# Patient Record
Sex: Female | Born: 1995 | Race: Black or African American | Hispanic: No | Marital: Single | Smoking: Heavy tobacco smoker
Health system: Southern US, Community
[De-identification: ages and names within clinical notes are randomized; demographics above are authoritative.]

## PROBLEM LIST (undated history)

## (undated) DIAGNOSIS — J45909 Unspecified asthma, uncomplicated: Secondary | ICD-10-CM

## (undated) HISTORY — PX: CHOLECYSTECTOMY: SHX55

---

## 2014-08-18 ENCOUNTER — Emergency Department (HOSPITAL_COMMUNITY): Payer: BLUE CROSS/BLUE SHIELD

## 2014-08-18 ENCOUNTER — Emergency Department (HOSPITAL_COMMUNITY)
Admission: EM | Admit: 2014-08-18 | Discharge: 2014-08-18 | Disposition: A | Discharge status: Home / Self Care | Payer: BLUE CROSS/BLUE SHIELD | Attending: Emergency Medicine | Admitting: Emergency Medicine

## 2014-08-18 ENCOUNTER — Encounter (HOSPITAL_COMMUNITY): Payer: Self-pay | Admitting: Emergency Medicine

## 2014-08-18 DIAGNOSIS — R109 Unspecified abdominal pain: Secondary | ICD-10-CM | POA: Insufficient documentation

## 2014-08-18 DIAGNOSIS — R102 Pelvic and perineal pain: Secondary | ICD-10-CM | POA: Insufficient documentation

## 2014-08-18 DIAGNOSIS — J45909 Unspecified asthma, uncomplicated: Secondary | ICD-10-CM | POA: Insufficient documentation

## 2014-08-18 DIAGNOSIS — Z3202 Encounter for pregnancy test, result negative: Secondary | ICD-10-CM | POA: Diagnosis not present

## 2014-08-18 HISTORY — DX: Unspecified asthma, uncomplicated: J45.909

## 2014-08-18 LAB — URINALYSIS, ROUTINE W REFLEX MICROSCOPIC
BILIRUBIN URINE: NEGATIVE
GLUCOSE, UA: NEGATIVE mg/dL
HGB URINE DIPSTICK: NEGATIVE
KETONES UR: NEGATIVE mg/dL
Leukocytes, UA: NEGATIVE
Nitrite: NEGATIVE
PH: 7 (ref 5.0–8.0)
Protein, ur: NEGATIVE mg/dL
Specific Gravity, Urine: 1.007 (ref 1.005–1.030)
Urobilinogen, UA: 1 mg/dL (ref 0.0–1.0)

## 2014-08-18 LAB — HIV ANTIBODY (ROUTINE TESTING W REFLEX): HIV 1&2 Ab, 4th Generation: NONREACTIVE

## 2014-08-18 LAB — WET PREP, GENITAL
TRICH WET PREP: NONE SEEN
WBC WET PREP: NONE SEEN
YEAST WET PREP: NONE SEEN

## 2014-08-18 LAB — PREGNANCY, URINE: Preg Test, Ur: NEGATIVE

## 2014-08-18 MED ORDER — NAPROXEN 500 MG PO TABS
500.0000 mg | ORAL_TABLET | Freq: Once | ORAL | Status: AC
  Administered 2014-08-18: 500 mg via ORAL
  Filled 2014-08-18: qty 1

## 2014-08-18 MED ORDER — IOHEXOL 300 MG/ML  SOLN
100.0000 mL | Freq: Once | INTRAMUSCULAR | Status: AC | PRN
  Administered 2014-08-18: 100 mL via INTRAVENOUS

## 2014-08-18 MED ORDER — HYDROMORPHONE HCL 1 MG/ML IJ SOLN
1.0000 mg | Freq: Once | INTRAMUSCULAR | Status: AC
  Administered 2014-08-18: 1 mg via INTRAVENOUS
  Filled 2014-08-18: qty 1

## 2014-08-18 MED ORDER — HYDROCODONE-ACETAMINOPHEN 5-325 MG PO TABS: 1.0000 | ORAL_TABLET | ORAL | Status: AC | PRN

## 2014-08-18 MED ORDER — DIPHENHYDRAMINE HCL 50 MG/ML IJ SOLN
25.0000 mg | Freq: Once | INTRAMUSCULAR | Status: AC
  Administered 2014-08-18: 25 mg via INTRAVENOUS
  Filled 2014-08-18: qty 1

## 2014-08-18 MED ORDER — IOHEXOL 300 MG/ML  SOLN
50.0000 mL | Freq: Once | INTRAMUSCULAR | Status: AC | PRN
  Administered 2014-08-18: 50 mL via ORAL

## 2014-08-18 NOTE — ED Notes (Signed)
Bed: WA03 Expected date:  Expected time:  Means of arrival:  Comments: 

## 2014-08-18 NOTE — ED Provider Notes (Signed)
CSN: [id number]     Arrival date & time 08/18/14  0119 History   First MD Initiated Contact with Patient 08/18/14 0324     Chief Complaint  Patient presents with  . Vaginal Pain     (Consider location/radiation/quality/duration/timing/severity/associated sxs/prior Treatment) HPI Comments: 18 year old female, presents with acute onset of vaginal pain 3 hours ago while she was coughing. She states this happened while she was in a movie theater and had a coughing fit and since that time has had persistent vaginal discomfort which is worse with moving, worse with coughing. She denies any new or different sexual activity, denies vaginal discharge, does endorse a small amount of dysuria. This pain is associated with right lower belly pain and lower back pain.  Patient is a 18 y.o. female presenting with vaginal pain. The history is provided by the patient.  Vaginal Pain    Past Medical History  Diagnosis Date  . Asthma    History reviewed. No pertinent past surgical history. No family history on file. History  Substance Use Topics  . Smoking status: Never Smoker   . Smokeless tobacco: Not on file  . Alcohol Use: No   OB History    No data available     Review of Systems  Genitourinary: Positive for vaginal pain.  All other systems reviewed and are negative.     Allergies  Strawberry  Home Medications   Prior to Admission medications   Not on File   BP 126/87 mmHg  Pulse 83  Temp(Src) 98 F (36.7 C) (Oral)  Resp 16  Ht 5\' 3"  (1.6 m)  SpO2 100%  LMP 08/04/2014 Physical Exam  Constitutional: She appears well-developed and well-nourished. No distress.  HENT:  Head: Normocephalic and atraumatic.  Mouth/Throat: Oropharynx is clear and moist. No oropharyngeal exudate.  Eyes: Conjunctivae and EOM are normal. Pupils are equal, round, and reactive to light. Right eye exhibits no discharge. Left eye exhibits no discharge. No scleral icterus.  Neck: Normal range of  motion. Neck supple. No JVD present. No thyromegaly present.  Cardiovascular: Normal rate, regular rhythm, normal heart sounds and intact distal pulses.  Exam reveals no gallop and no friction rub.   No murmur heard. Pulmonary/Chest: Effort normal and breath sounds normal. No respiratory distress. She has no wheezes. She has no rales.  Abdominal: Soft. Bowel sounds are normal. She exhibits no distension and no mass. There is tenderness (minimal tenderness to the right lower abdomen and suprapubic area).  Musculoskeletal: Normal range of motion. She exhibits no edema or tenderness.  Lymphadenopathy:    She has no cervical adenopathy.  Neurological: She is alert. Coordination normal.  Skin: Skin is warm and dry. No rash noted. No erythema.  Psychiatric: She has a normal mood and affect. Her behavior is normal.  Nursing note and vitals reviewed.   ED Course  Procedures (including critical care time) Labs Review Labs Reviewed  GC/CHLAMYDIA PROBE AMP  WET PREP, GENITAL  URINALYSIS, ROUTINE W REFLEX MICROSCOPIC  PREGNANCY, URINE  HIV ANTIBODY (ROUTINE TESTING)    Imaging Review No results found.    MDM   Final diagnoses:  None    The patient has normal vital signs, check internal vaginal exam, possibly ovarian cyst, possibly muscle strain given circumstances, urinary evaluation shows no infection, possibly pregnancy.  At 7:30 AM, the patient was informed of her results, there was a moderate amount of free fluid on the ultrasound, she has ongoing pain especially at the right lower quadrant, CT  scan has been ordered to rule out other etiologies such as appendicitis or ruptured appendicitis.  Change of shift - care signed out to oncoming EDP  Meds given in ED:  Medications  HYDROmorphone (DILAUDID) injection 1 mg (not administered)  iohexol (OMNIPAQUE) 300 MG/ML solution 50 mL (not administered)  naproxen (NAPROSYN) tablet 500 mg (500 mg Oral Given 08/18/14 0410)    New  Prescriptions   No medications on file      Vida RollerBrian D Luca Burston, MD 08/18/14 2349

## 2014-08-18 NOTE — ED Notes (Signed)
Pt c/o vaginal pain onset 3 hours ago, no known cause. Pt denies dysuria pt states pain when she "pushes" denies fever, denies n/v/d.

## 2014-08-18 NOTE — Discharge Instructions (Signed)

## 2014-08-18 NOTE — ED Notes (Addendum)
Pt called out and sts that she is itching all over from medication or contrast. Dr Patria Maneampos aware

## 2014-08-21 LAB — GC/CHLAMYDIA PROBE AMP: CT Probe RNA: UNDETERMINED

## 2014-09-04 ENCOUNTER — Encounter (HOSPITAL_COMMUNITY): Payer: Self-pay | Admitting: Emergency Medicine

## 2014-09-04 ENCOUNTER — Emergency Department (HOSPITAL_COMMUNITY)
Admission: EM | Admit: 2014-09-04 | Discharge: 2014-09-05 | Disposition: A | Discharge status: Home / Self Care | Payer: BC Managed Care – PPO | Attending: Emergency Medicine | Admitting: Emergency Medicine

## 2014-09-04 DIAGNOSIS — N281 Cyst of kidney, acquired: Secondary | ICD-10-CM | POA: Insufficient documentation

## 2014-09-04 DIAGNOSIS — R1013 Epigastric pain: Secondary | ICD-10-CM

## 2014-09-04 DIAGNOSIS — Z79899 Other long term (current) drug therapy: Secondary | ICD-10-CM | POA: Diagnosis not present

## 2014-09-04 DIAGNOSIS — J45909 Unspecified asthma, uncomplicated: Secondary | ICD-10-CM | POA: Diagnosis not present

## 2014-09-04 DIAGNOSIS — R112 Nausea with vomiting, unspecified: Secondary | ICD-10-CM

## 2014-09-04 DIAGNOSIS — R197 Diarrhea, unspecified: Secondary | ICD-10-CM | POA: Insufficient documentation

## 2014-09-04 DIAGNOSIS — Z3202 Encounter for pregnancy test, result negative: Secondary | ICD-10-CM | POA: Diagnosis not present

## 2014-09-04 MED ORDER — SODIUM CHLORIDE 0.9 % IV SOLN: 1000.0000 mL | INTRAVENOUS | Status: DC

## 2014-09-04 MED ORDER — SODIUM CHLORIDE 0.9 % IV SOLN
1000.0000 mL | Freq: Once | INTRAVENOUS | Status: AC
  Administered 2014-09-05: 1000 mL via INTRAVENOUS

## 2014-09-04 MED ORDER — HYDROMORPHONE HCL 1 MG/ML IJ SOLN
0.5000 mg | INTRAMUSCULAR | Status: DC | PRN
  Administered 2014-09-05: 0.5 mg via INTRAVENOUS
  Filled 2014-09-04: qty 1

## 2014-09-04 MED ORDER — ONDANSETRON HCL 4 MG/2ML IJ SOLN
4.0000 mg | Freq: Once | INTRAMUSCULAR | Status: AC
  Administered 2014-09-05: 4 mg via INTRAVENOUS
  Filled 2014-09-04: qty 2

## 2014-09-04 NOTE — ED Notes (Addendum)
Patient reports N, V, dizziness starting today before work. Started feeling dizziness again at work. Had diarrhea today. C/o generalized abdominal pain with vomiting. Also c/o back pain. Says there is a chance she could be pregnant. Hx asthmas. Reports SOB but no chest pain. RR even/unlabored. Ambulatory with steady gait. [facility] [place] [facility] but [facility].

## 2014-09-04 NOTE — ED Provider Notes (Signed)
CSN: [id number]     Arrival date & time 09/04/14  2219 History   First MD Initiated Contact with Patient 09/04/14 2302     Chief Complaint  Patient presents with  . Nausea  . Emesis    Patient is a 18 y.o. female presenting with vomiting. The history is provided by the patient.  Emesis Severity:  Moderate Duration:  1 day Timing:  Constant Number of daily episodes:  20 Quality:  Stomach contents Progression:  Unchanged Chronicity:  New Relieved by:  Nothing Worsened by:  Food smell and liquids Associated symptoms: abdominal pain, chills and diarrhea (just one)   Associated symptoms: no fever   Abdominal pain:    Location:  Epigastric   Quality:  Sharp   Past Medical History  Diagnosis Date  . Asthma    History reviewed. No pertinent past surgical history. History reviewed. No pertinent family history. History  Substance Use Topics  . Smoking status: Never Smoker   . Smokeless tobacco: Not on file  . Alcohol Use: No   OB History    No data available     Review of Systems  Constitutional: Positive for chills. Negative for fever.  Gastrointestinal: Positive for vomiting, abdominal pain and diarrhea (just one).  Genitourinary: Negative for dysuria, vaginal bleeding and vaginal discharge.  Musculoskeletal: Positive for back pain.  All other systems reviewed and are negative.     Allergies  Shellfish allergy; Strawberry; and Contrast media  Home Medications   Prior to Admission medications   Medication Sig Start Date End Date Taking? Authorizing Provider  albuterol (PROVENTIL HFA;VENTOLIN HFA) 108 (90 BASE) MCG/ACT inhaler Inhale 1-2 puffs into the lungs every 6 (six) hours as needed for wheezing or shortness of breath.   Yes Historical Provider, MD  albuterol (PROVENTIL) (2.5 MG/3ML) 0.083% nebulizer solution Take 2.5 mg by nebulization every 6 (six) hours as needed for wheezing or shortness of breath.   Yes Historical Provider, MD  HYDROcodone-acetaminophen  (NORCO/VICODIN) 5-325 MG per tablet Take 1 tablet by mouth every 4 (four) hours as needed for moderate pain. 08/18/14   Lyanne CoKevin M Campos, MD  ondansetron (ZOFRAN) 4 MG tablet Take 1 tablet (4 mg total) by mouth every 6 (six) hours. 09/05/14   Linwood DibblesJon Christiann Hagerty, MD   BP 113/72 mmHg  Pulse 104  Temp(Src) 98.4 F (36.9 C) (Oral)  Resp 18  SpO2 100%  LMP 08/04/2014 Physical Exam  Constitutional: She appears well-developed and well-nourished. No distress.  HENT:  Head: Normocephalic and atraumatic.  Right Ear: External ear normal.  Left Ear: External ear normal.  Eyes: Conjunctivae are normal. Right eye exhibits no discharge. Left eye exhibits no discharge. No scleral icterus.  Neck: Neck supple. No tracheal deviation present.  Cardiovascular: Normal rate, regular rhythm and intact distal pulses.   Pulmonary/Chest: Effort normal and breath sounds normal. No stridor. No respiratory distress. She has no wheezes. She has no rales.  Abdominal: Soft. Bowel sounds are normal. She exhibits no distension. There is tenderness in the epigastric area. There is no rebound and no guarding.  Musculoskeletal: She exhibits no edema or tenderness.  Neurological: She is alert. She has normal strength. No cranial nerve deficit (no facial droop, extraocular movements intact, no slurred speech) or sensory deficit. She exhibits normal muscle tone. She displays no seizure activity. Coordination normal.  Skin: Skin is warm and dry. No rash noted.  Psychiatric: She has a normal mood and affect.  Nursing note and vitals reviewed.   ED Course  Procedures (including critical care time) Labs Review Labs Reviewed  CBC WITH DIFFERENTIAL - Abnormal; Notable for the following:    Hemoglobin 11.9 (*)    Neutrophils Relative % 87 (*)    Lymphocytes Relative 7 (*)    Lymphs Abs 0.6 (*)    All other components within normal limits  COMPREHENSIVE METABOLIC PANEL - Abnormal; Notable for the following:    Sodium 134 (*)     Potassium 3.2 (*)    All other components within normal limits  URINALYSIS, ROUTINE W REFLEX MICROSCOPIC - Abnormal; Notable for the following:    Hgb urine dipstick SMALL (*)    Ketones, ur 40 (*)    All other components within normal limits  URINE MICROSCOPIC-ADD ON - Abnormal; Notable for the following:    Squamous Epithelial / LPF FEW (*)    All other components within normal limits  LIPASE, BLOOD  PREGNANCY, URINE    Imaging Review [place] Abdomen Complete  09/05/2014   CLINICAL DATA:  Epigastric abdominal pain  EXAM: ULTRASOUND ABDOMEN COMPLETE  COMPARISON:  Abdominal CT 08/18/2014  FINDINGS: Gallbladder: No gallstones or wall thickening visualized. No sonographic Murphy sign noted.  Common bile duct: Diameter: 5 mm  Liver: No focal lesion identified. Within normal limits in parenchymal echogenicity.  IVC: No abnormality visualized.  Pancreas: Limited visualization.  No indication of pathology.  Spleen: Limited visualization  Right Kidney: Length: 10 cm. There is a 2.6 cm hypoechoic cortical lesion with multiple this septations which are nonvascular. No change from prior to suggest this represents an abscess or other acute process.  Left Kidney: Length: 10 cm. Echogenicity within normal limits. No mass or hydronephrosis visualized.  Abdominal aorta: No aneurysm visualized.  Other findings: None.  IMPRESSION: 1. No acute findings to explain abdominal pain. 2. 2.6 cm complex cyst within the right kidney. Given the patient's young age this may represent a previously hemorrhagic or infected cyst, but follow-up is required. Recommend renal protocol MRI in 6 months.   Electronically Signed   By: Tiburcio PeaJonathan  Watts M.D.   On: 09/05/2014 04:24    Medications  0.9 %  sodium chloride infusion (0 mLs Intravenous Stopped 09/05/14 0220)    Followed by  0.9 %  sodium chloride infusion (not administered)  HYDROmorphone (DILAUDID) injection 0.5 mg (0.5 mg Intravenous Given 09/05/14 0121)  ondansetron (ZOFRAN)  injection 4 mg (4 mg Intravenous Given 09/05/14 0112)     MDM   Final diagnoses:  Epigastric pain  Nausea and vomiting, vomiting of unspecified type  Renal cyst    No gallstones.  Labs otherwise normal. ?viral  Incidental renal cyst.  Follow up with PCP to have 6 month follow up.  At this time there does not appear to be any evidence of an acute emergency medical condition and the patient appears stable for discharge with appropriate outpatient follow up.     Linwood DibblesJon Maricruz Lucero, MD 09/05/14 [phone]

## 2014-09-04 NOTE — ED Notes (Signed)
Patient c/o N/V/D x1 day, reports 20+ episodes of emesis today, 1 episode of diarrhea, flashes/chills, lower intermittent abdominal pain described as "stabbing and aching". Rates pain 10/10.

## 2014-09-05 ENCOUNTER — Emergency Department (HOSPITAL_COMMUNITY): Payer: BC Managed Care – PPO

## 2014-09-05 LAB — COMPREHENSIVE METABOLIC PANEL
ALT: 13 U/L (ref 0–35)
AST: 19 U/L (ref 0–37)
Albumin: 4.1 g/dL (ref 3.5–5.2)
Alkaline Phosphatase: 72 U/L (ref 39–117)
Anion gap: 13 (ref 5–15)
BUN: 10 mg/dL (ref 6–23)
CO2: 23 mEq/L (ref 19–32)
CREATININE: 0.74 mg/dL (ref 0.50–1.10)
Calcium: 9.6 mg/dL (ref 8.4–10.5)
Chloride: 98 mEq/L (ref 96–112)
GFR calc Af Amer: >90 mL/min (ref >90)
Glucose, Bld: 87 mg/dL (ref 70–99)
Potassium: 3.2 mEq/L — ABNORMAL LOW (ref 3.7–5.3)
Sodium: 134 mEq/L — ABNORMAL LOW (ref 137–147)
Total Bilirubin: 0.7 mg/dL (ref 0.3–1.2)
Total Protein: 8.2 g/dL (ref 6.0–8.3)

## 2014-09-05 LAB — URINALYSIS, ROUTINE W REFLEX MICROSCOPIC
Bilirubin Urine: NEGATIVE
GLUCOSE, UA: NEGATIVE mg/dL
KETONES UR: 40 mg/dL — AB
LEUKOCYTES UA: NEGATIVE
Nitrite: NEGATIVE
PH: 6.5 (ref 5.0–8.0)
PROTEIN: NEGATIVE mg/dL
Specific Gravity, Urine: 1.026 (ref 1.005–1.030)
Urobilinogen, UA: 1 mg/dL (ref 0.0–1.0)

## 2014-09-05 LAB — URINE MICROSCOPIC-ADD ON

## 2014-09-05 LAB — CBC WITH DIFFERENTIAL/PLATELET
Basophils Absolute: 0 10*3/uL (ref 0.0–0.1)
Basophils Relative: 0 % (ref 0–1)
EOS ABS: 0.1 10*3/uL (ref 0.0–0.7)
Eosinophils Relative: 1 % (ref 0–5)
HCT: 37.9 % (ref 36.0–46.0)
HEMOGLOBIN: 11.9 g/dL — AB (ref 12.0–15.0)
Lymphocytes Relative: 7 % — ABNORMAL LOW (ref 12–46)
Lymphs Abs: 0.6 10*3/uL — ABNORMAL LOW (ref 0.7–4.0)
MCH: 28.6 pg (ref 26.0–34.0)
MCHC: 31.4 g/dL (ref 30.0–36.0)
MCV: 91.1 fL (ref 78.0–100.0)
MONOS PCT: 5 % (ref 3–12)
Monocytes Absolute: 0.4 10*3/uL (ref 0.1–1.0)
NEUTROS PCT: 87 % — AB (ref 43–77)
Neutro Abs: 6.8 10*3/uL (ref 1.7–7.7)
Platelets: 358 10*3/uL (ref 150–400)
RBC: 4.16 MIL/uL (ref 3.87–5.11)
RDW: 12.8 % (ref 11.5–15.5)
WBC: 7.8 10*3/uL (ref 4.0–10.5)

## 2014-09-05 LAB — LIPASE, BLOOD: Lipase: 22 U/L (ref 11–59)

## 2014-09-05 LAB — PREGNANCY, URINE: Preg Test, Ur: NEGATIVE

## 2014-09-05 MED ORDER — ONDANSETRON HCL 4 MG PO TABS: 4.0000 mg | ORAL_TABLET | Freq: Four times a day (QID) | ORAL | Status: AC

## 2014-09-05 NOTE — Discharge Instructions (Signed)

## 2014-09-05 NOTE — ED Notes (Signed)
Ultrasound at bedside

## 2014-09-05 NOTE — ED Notes (Signed)
Patient is a difficult IV start, 3 RN's were unsuccessful after a total of 6 attempts.  She is aware that IV team will be starting her IV and obtaining labs.

## 2014-11-12 ENCOUNTER — Encounter (HOSPITAL_COMMUNITY): Payer: Self-pay

## 2014-11-12 ENCOUNTER — Emergency Department (HOSPITAL_COMMUNITY)
Admission: EM | Admit: 2014-11-12 | Discharge: 2014-11-12 | Discharge status: Against Medical Advice | Payer: BLUE CROSS/BLUE SHIELD | Attending: Emergency Medicine | Admitting: Emergency Medicine

## 2014-11-12 DIAGNOSIS — R109 Unspecified abdominal pain: Secondary | ICD-10-CM | POA: Diagnosis not present

## 2014-11-12 DIAGNOSIS — J45909 Unspecified asthma, uncomplicated: Secondary | ICD-10-CM | POA: Insufficient documentation

## 2014-11-12 DIAGNOSIS — R197 Diarrhea, unspecified: Secondary | ICD-10-CM | POA: Diagnosis not present

## 2014-11-12 DIAGNOSIS — M549 Dorsalgia, unspecified: Secondary | ICD-10-CM | POA: Insufficient documentation

## 2014-11-12 DIAGNOSIS — R1110 Vomiting, unspecified: Secondary | ICD-10-CM | POA: Insufficient documentation

## 2014-11-12 LAB — URINALYSIS, ROUTINE W REFLEX MICROSCOPIC
Bilirubin Urine: NEGATIVE
GLUCOSE, UA: NEGATIVE mg/dL
Hgb urine dipstick: NEGATIVE
KETONES UR: NEGATIVE mg/dL
Nitrite: NEGATIVE
Protein, ur: NEGATIVE mg/dL
Specific Gravity, Urine: 1.016 (ref 1.005–1.030)
Urobilinogen, UA: 0.2 mg/dL (ref 0.0–1.0)
pH: 6.5 (ref 5.0–8.0)

## 2014-11-12 LAB — CBC WITH DIFFERENTIAL/PLATELET
BASOS ABS: 0 10*3/uL (ref 0.0–0.1)
Basophils Relative: 0 % (ref 0–1)
EOS PCT: 2 % (ref 0–5)
Eosinophils Absolute: 0.1 10*3/uL (ref 0.0–0.7)
HEMATOCRIT: 36.5 % (ref 36.0–46.0)
Hemoglobin: 11.4 g/dL — ABNORMAL LOW (ref 12.0–15.0)
Lymphocytes Relative: 21 % (ref 12–46)
Lymphs Abs: 1 10*3/uL (ref 0.7–4.0)
MCH: 27.9 pg (ref 26.0–34.0)
MCHC: 31.2 g/dL (ref 30.0–36.0)
MCV: 89.5 fL (ref 78.0–100.0)
Monocytes Absolute: 0.4 10*3/uL (ref 0.1–1.0)
Monocytes Relative: 8 % (ref 3–12)
NEUTROS PCT: 69 % (ref 43–77)
Neutro Abs: 3.2 10*3/uL (ref 1.7–7.7)
Platelets: 310 10*3/uL (ref 150–400)
RBC: 4.08 MIL/uL (ref 3.87–5.11)
RDW: 13.2 % (ref 11.5–15.5)
WBC: 4.6 10*3/uL (ref 4.0–10.5)

## 2014-11-12 LAB — URINE MICROSCOPIC-ADD ON

## 2014-11-12 LAB — BASIC METABOLIC PANEL
Anion gap: 8 (ref 5–15)
BUN: 9 mg/dL (ref 6–23)
CHLORIDE: 104 mmol/L (ref 96–112)
CO2: 24 mmol/L (ref 19–32)
CREATININE: 0.62 mg/dL (ref 0.50–1.10)
Calcium: 8.4 mg/dL (ref 8.4–10.5)
GFR calc Af Amer: >90 mL/min (ref >90)
GLUCOSE: 76 mg/dL (ref 70–99)
Potassium: 3.3 mmol/L — ABNORMAL LOW (ref 3.5–5.1)
Sodium: 136 mmol/L (ref 135–145)

## 2014-11-12 LAB — POC URINE PREG, ED: Preg Test, Ur: NEGATIVE

## 2014-11-12 NOTE — ED Notes (Signed)
Pt complains of abdominal pain and back pain since this am, she states she vomited once and had one episode of diarrhea

## 2015-04-12 ENCOUNTER — Emergency Department (HOSPITAL_COMMUNITY)
Admission: EM | Admit: 2015-04-12 | Discharge: 2015-04-13 | Disposition: A | Discharge status: Home / Self Care | Payer: BLUE CROSS/BLUE SHIELD | Attending: Emergency Medicine | Admitting: Emergency Medicine

## 2015-04-12 ENCOUNTER — Emergency Department (HOSPITAL_COMMUNITY): Payer: BLUE CROSS/BLUE SHIELD

## 2015-04-12 ENCOUNTER — Encounter (HOSPITAL_COMMUNITY): Payer: Self-pay | Admitting: *Deleted

## 2015-04-12 DIAGNOSIS — O99511 Diseases of the respiratory system complicating pregnancy, first trimester: Secondary | ICD-10-CM | POA: Diagnosis not present

## 2015-04-12 DIAGNOSIS — S79912A Unspecified injury of left hip, initial encounter: Secondary | ICD-10-CM | POA: Insufficient documentation

## 2015-04-12 DIAGNOSIS — Y998 Other external cause status: Secondary | ICD-10-CM | POA: Insufficient documentation

## 2015-04-12 DIAGNOSIS — O9A211 Injury, poisoning and certain other consequences of external causes complicating pregnancy, first trimester: Secondary | ICD-10-CM | POA: Insufficient documentation

## 2015-04-12 DIAGNOSIS — J45909 Unspecified asthma, uncomplicated: Secondary | ICD-10-CM | POA: Diagnosis not present

## 2015-04-12 DIAGNOSIS — Z3490 Encounter for supervision of normal pregnancy, unspecified, unspecified trimester: Secondary | ICD-10-CM

## 2015-04-12 DIAGNOSIS — Z3A13 13 weeks gestation of pregnancy: Secondary | ICD-10-CM | POA: Diagnosis not present

## 2015-04-12 DIAGNOSIS — Z79899 Other long term (current) drug therapy: Secondary | ICD-10-CM | POA: Diagnosis not present

## 2015-04-12 DIAGNOSIS — Y9289 Other specified places as the place of occurrence of the external cause: Secondary | ICD-10-CM | POA: Diagnosis not present

## 2015-04-12 DIAGNOSIS — Y9301 Activity, walking, marching and hiking: Secondary | ICD-10-CM | POA: Insufficient documentation

## 2015-04-12 DIAGNOSIS — W108XXA Fall (on) (from) other stairs and steps, initial encounter: Secondary | ICD-10-CM | POA: Insufficient documentation

## 2015-04-12 DIAGNOSIS — S3991XA Unspecified injury of abdomen, initial encounter: Secondary | ICD-10-CM | POA: Diagnosis not present

## 2015-04-12 DIAGNOSIS — M25552 Pain in left hip: Secondary | ICD-10-CM

## 2015-04-12 DIAGNOSIS — R109 Unspecified abdominal pain: Secondary | ICD-10-CM

## 2015-04-12 MED ORDER — ACETAMINOPHEN 500 MG PO TABS
1000.0000 mg | ORAL_TABLET | Freq: Once | ORAL | Status: AC
  Administered 2015-04-12: 1000 mg via ORAL
  Filled 2015-04-12: qty 2

## 2015-04-12 NOTE — ED Provider Notes (Signed)
TIME SEEN: 11:19 PM  CHIEF COMPLAINT: LLQ Pain and Left Hip Pain; Ground Level Fall  HPI:  HPI Comments: Denise Schultz is a 19 y.o. female who is 13 weeks and 3 days pregnant presents to the Emergency Department complaining of sudden onset, left hip pain s/p ground level fall that occurred earlier tonight around 8 PM (approximately 3.5 hours ago). Pt states that her left hip gave out while walking up stairs, prompting her to fall. Pt states that she landed on her left side on wooden steps. She is also complaining of LLQ abdominal pain that occurred immediately after falling. She was able to ambulate after the incident. Denies head injury or LOC. No vaginal pain, vaginal discharge, dysuria, hematuria, numbness or tingling in extremities, focal weakness, back or neck pain, or any other associated symptoms. G1P0. Pt is not currently on any anticoagulants. She states that she did not have any abdominal pain prior to her fall.  OBGYN - Dr. Daphene Jaeger in [place], [place]   ROS: See HPI Constitutional: no fever  Eyes: no drainage  ENT: no runny nose   Cardiovascular:  no chest pain  Resp: no SOB  GI: LLQ abdominal pain. no vomiting GU: no dysuria, hematuria, vaginal pain, vaginal discharge.  Integumentary: no rash  Allergy: no hives  Musculoskeletal: Left hip pain. no leg swelling  Neurological: no slurred speech ROS otherwise negative  PAST MEDICAL HISTORY/PAST SURGICAL HISTORY:  Past Medical History  Diagnosis Date  . Asthma     MEDICATIONS:  Prior to Admission medications   Medication Sig Start Date End Date Taking? Authorizing Provider  albuterol (PROVENTIL HFA;VENTOLIN HFA) 108 (90 BASE) MCG/ACT inhaler Inhale 1-2 puffs into the lungs every 6 (six) hours as needed for wheezing or shortness of breath.    Historical Provider, MD  albuterol (PROVENTIL) (2.5 MG/3ML) 0.083% nebulizer solution Take 2.5 mg by nebulization every 6 (six) hours as needed for wheezing or shortness of breath.     Historical Provider, MD  HYDROcodone-acetaminophen (NORCO/VICODIN) 5-325 MG per tablet Take 1 tablet by mouth every 4 (four) hours as needed for moderate pain. 08/18/14   [name], MD  ondansetron (ZOFRAN) 4 MG tablet Take 1 tablet (4 mg total) by mouth every 6 (six) hours. 09/05/14   [name], MD    ALLERGIES:  Allergies  Allergen Reactions  . Shellfish Allergy Hives  . Strawberry Hives and Itching  . Contrast Media [Iodinated Diagnostic Agents] Rash    SOCIAL HISTORY:  History  Substance Use Topics  . Smoking status: Never Smoker   . Smokeless tobacco: Not on file  . Alcohol Use: No    FAMILY HISTORY: History reviewed. No pertinent family history.  EXAM: Triage Vitals: BP 98/52 mmHg  Pulse 69  Temp(Src) 98.6 F (37 C)  Resp 20  Ht  (1.6 m)  Wt 180 lb (81.647 kg)  BMI 31.89 kg/m2  SpO2 100%  LMP 10/22/2014   CONSTITUTIONAL: Alert and oriented and responds appropriately to questions. Well-appearing; well-nourished; GCS 15 HEAD: Normocephalic; atraumatic EYES: Conjunctivae clear, PERRL, EOMI ENT: normal nose; no rhinorrhea; moist mucous membranes; pharynx without lesions noted; no dental injury; no septal hematoma NECK: Supple, no meningismus, no LAD; no midline spinal tenderness, step-off or deformity CARD: RRR; S1 and S2 appreciated; no murmurs, no clicks, no rubs, no gallops RESP: Normal chest excursion without splinting or tachypnea; breath sounds clear and equal bilaterally; no wheezes, no rhonchi, no rales; no hypoxia or respiratory distress CHEST:  chest wall stable, no  crepitus or ecchymosis or deformity, nontender to palpation ABD/GI: Normal bowel sounds; non-distended; soft, tender in LLQ but when distracted her abdominal exam is benign, no rebound, no guarding PELVIS:  stable, nontender to palpation BACK:  The back appears normal and is non-tender to palpation, there is no CVA tenderness; no midline spinal tenderness, step-off or deformity EXT:  Normal ROM in all joints; tender over left anterior and lateral hip without obvious deformity or leg length discrepancy; no edema; normal capillary refill; no cyanosis, otherwise no bony tenderness or bony deformity of patient's extremities, no joint effusion, no ecchymosis or lacerations    SKIN: Normal color for age and race; warm NEURO: Moves all extremities equally, sensation to light touch intact diffusely, cranial nerves II through XII intact, normal gait PSYCH: The patient's mood and manner are appropriate. Grooming and personal hygiene are appropriate.  MEDICAL DECISION MAKING: Patient here with mechanical fall. Reports she has had problems with her left hip in the past where she feels like it will give out on her. States she fell onto her left hip and abdomen. Denies head injury. She is currently neurologically intact. Her abdominal exam is benign. She has been ambulatory. Have discussed with patient that I do not feel she needs emergent CT imaging of her abdomen as well as suspicion for any life-threatening injuries very low and I feel the risks of radiation exposure would outweigh any benefit. Have offered her an x-ray of her left hip have discussed with patient given she has full range of motion in this joint and has been ambulatory with suspicion for fracture is very low. Patient at this time would like to hold off on any imaging of her hip. I feel this is reasonable. We'll give her a dose of Tylenol for pain and have advised her to use Tylenol at home. Have discussed with her at length strict return precautions (including worsening pain, abdominal distention, hematuria, hematochezia, hematemesis, neurologic deficits) and importance of outpatient follow-up with her OB/GYN. She verbalized understanding and is comfortable with this plan.  Fetal heart tones are in the 160s. I have performed a bedside ultrasound which shows a intrauterine pregnancy with good fetal activity and normal fetal heart  rate. Mother and father have visualized their baby on the ultrasound as well.  I personally performed the services described in this documentation, which was scribed in my presence. The recorded information has been reviewed and is accurate.    Layla Maw Vedha Tercero, DO 04/12/15 2349

## 2015-04-12 NOTE — ED Notes (Signed)
Pt states her left hip gave out while she was walking up the stairs. Pt c/o llq pain and left hip pain. Pt is [redacted] weeks pregnant.

## 2015-04-12 NOTE — Discharge Instructions (Signed)
You may take Tylenol 1000 mg every 6 hours as needed for pain.  Abdominal Pain During Pregnancy Abdominal pain is common in pregnancy. Most of the time, it does not cause harm. There are many causes of abdominal pain. Some causes are more serious than others. Some of the causes of abdominal pain in pregnancy are easily diagnosed. Occasionally, the diagnosis takes time to understand. Other times, the cause is not determined. Abdominal pain can be a sign that something is very wrong with the pregnancy, or the pain may have nothing to do with the pregnancy at all. For this reason, always tell your health care provider if you have any abdominal discomfort. HOME CARE INSTRUCTIONS  Monitor your abdominal pain for any changes. The following actions may help to alleviate any discomfort you are experiencing:  Do not have sexual intercourse or put anything in your vagina until your symptoms go away completely.  Get plenty of rest until your pain improves.  Drink clear fluids if you feel nauseous. Avoid solid food as long as you are uncomfortable or nauseous.  Only take over-the-counter or prescription medicine as directed by your health care provider.  Keep all follow-up appointments with your health care provider. SEEK IMMEDIATE MEDICAL CARE IF:  You are bleeding, leaking fluid, or passing tissue from the vagina.  You have increasing pain or cramping.  You have persistent vomiting.  You have painful or bloody urination.  You have a fever.  You notice a decrease in your baby's movements.  You have extreme weakness or feel faint.  You have shortness of breath, with or without abdominal pain.  You develop a severe headache with abdominal pain.  You have abnormal vaginal discharge with abdominal pain.  You have persistent diarrhea.  You have abdominal pain that continues even after rest, or gets worse. MAKE SURE YOU:   Understand these instructions.  Will watch your  condition.  Will get help right away if you are not doing well or get worse. Document Released: 09/07/2005 Document Revised: 06/28/2013 Document Reviewed: 04/06/2013 [facility] Patient Information 2015 [place], [place]. This information is not intended to replace advice given to you by your health care provider. Make sure you discuss any questions you have with your health care provider.   Contusion A contusion is a deep bruise. Contusions are the result of an injury that caused bleeding under the skin. The contusion may turn blue, purple, or yellow. Minor injuries will give you a painless contusion, but more severe contusions may stay painful and swollen for a few weeks.  CAUSES  A contusion is usually caused by a blow, trauma, or direct force to an area of the body. SYMPTOMS   Swelling and redness of the injured area.  Bruising of the injured area.  Tenderness and soreness of the injured area.  Pain. DIAGNOSIS  The diagnosis can be made by taking a history and physical exam. An X-ray, CT scan, or MRI may be needed to determine if there were any associated injuries, such as fractures. TREATMENT  Specific treatment will depend on what area of the body was injured. In general, the best treatment for a contusion is resting, icing, elevating, and applying cold compresses to the injured area. Over-the-counter medicines may also be recommended for pain control. Ask your caregiver what the best treatment is for your contusion. HOME CARE INSTRUCTIONS   Put ice on the injured area.  Put ice in a plastic bag.  Place a towel between your skin and the bag.  Leave the ice on for 15-20 minutes, 3-4 times a day, or as directed by your health care provider.  Only take over-the-counter or prescription medicines for pain, discomfort, or fever as directed by your caregiver. Your caregiver may recommend avoiding anti-inflammatory medicines (aspirin, ibuprofen, and naproxen) for 48 hours because these  medicines may increase bruising.  Rest the injured area.  If possible, elevate the injured area to reduce swelling. SEEK IMMEDIATE MEDICAL CARE IF:   You have increased bruising or swelling.  You have pain that is getting worse.  Your swelling or pain is not relieved with medicines. MAKE SURE YOU:   Understand these instructions.  Will watch your condition.  Will get help right away if you are not doing well or get worse. Document Released: 06/17/2005 Document Revised: 09/12/2013 Document Reviewed: 07/13/2011 [facility] Patient Information 2015 [place], [place]. This information is not intended to replace advice given to you by your health care provider. Make sure you discuss any questions you have with your health care provider.   Hip Pain Your hip is the joint between your upper legs and your lower pelvis. The bones, cartilage, tendons, and muscles of your hip joint perform a lot of work each day supporting your body weight and allowing you to move around. Hip pain can range from a minor ache to severe pain in one or both of your hips. Pain may be felt on the inside of the hip joint near the groin, or the outside near the buttocks and upper thigh. You may have swelling or stiffness as well.  HOME CARE INSTRUCTIONS   Take medicines only as directed by your health care provider.  Apply ice to the injured area:  Put ice in a plastic bag.  Place a towel between your skin and the bag.  Leave the ice on for 15-20 minutes at a time, 3-4 times a day.  Keep your leg raised (elevated) when possible to lessen swelling.  Avoid activities that cause pain.  Follow specific exercises as directed by your health care provider.  Sleep with a pillow between your legs on your most comfortable side.  Record how often you have hip pain, the location of the pain, and what it feels like. SEEK MEDICAL CARE IF:   You are unable to put weight on your leg.  Your hip is red or swollen or very  tender to touch.  Your pain or swelling continues or worsens after 1 week.  You have increasing difficulty walking.  You have a fever. SEEK IMMEDIATE MEDICAL CARE IF:   You have fallen.  You have a sudden increase in pain and swelling in your hip. MAKE SURE YOU:   Understand these instructions.  Will watch your condition.  Will get help right away if you are not doing well or get worse. Document Released: 02/25/2010 Document Revised: 01/22/2014 Document Reviewed: 05/04/2013 [facility] Patient Information 2015 [place], [place]. This information is not intended to replace advice given to you by your health care provider. Make sure you discuss any questions you have with your health care provider.  RICE: Routine Care for Injuries The routine care of many injuries includes Rest, Ice, Compression, and Elevation (RICE). HOME CARE INSTRUCTIONS  Rest is needed to allow your body to heal. Routine activities can usually be resumed when comfortable. Injured tendons and bones can take up to 6 weeks to heal. Tendons are the cord-like structures that attach muscle to bone.  Ice following an injury helps keep the swelling down and  reduces pain.  Put ice in a plastic bag.  Place a towel between your skin and the bag.  Leave the ice on for 15-20 minutes, 3-4 times a day, or as directed by your health care provider. Do this while awake, for the first 24 to 48 hours. After that, continue as directed by your caregiver.  Compression helps keep swelling down. It also gives support and helps with discomfort. If an elastic bandage has been applied, it should be removed and reapplied every 3 to 4 hours. It should not be applied tightly, but firmly enough to keep swelling down. Watch fingers or toes for swelling, bluish discoloration, coldness, numbness, or excessive pain. If any of these problems occur, remove the bandage and reapply loosely. Contact your caregiver if these problems continue.  Elevation  helps reduce swelling and decreases pain. With extremities, such as the arms, hands, legs, and feet, the injured area should be placed near or above the level of the heart, if possible. SEEK IMMEDIATE MEDICAL CARE IF:  You have persistent pain and swelling.  You develop redness, numbness, or unexpected weakness.  Your symptoms are getting worse rather than improving after several days. These symptoms may indicate that further evaluation or further X-rays are needed. Sometimes, X-rays may not show a small broken bone (fracture) until 1 week or 10 days later. Make a follow-up appointment with your caregiver. Ask when your X-ray results will be ready. Make sure you get your X-ray results. Document Released: 12/20/2000 Document Revised: 09/12/2013 Document Reviewed: 02/06/2011 [facility] Patient Information 2015 [place], [place]. This information is not intended to replace advice given to you by your health care provider. Make sure you discuss any questions you have with your health care provider.

## 2015-04-13 NOTE — ED Notes (Signed)
Pt wheeled out to car. Pt was still worried about her hip but refused xray due to being pregnant.

## 2018-01-28 ENCOUNTER — Other Ambulatory Visit (HOSPITAL_COMMUNITY): Payer: Self-pay | Admitting: Preventative Medicine

## 2018-01-31 ENCOUNTER — Other Ambulatory Visit (HOSPITAL_COMMUNITY): Payer: Self-pay | Admitting: Preventative Medicine

## 2018-01-31 DIAGNOSIS — S6292XA Unspecified fracture of left wrist and hand, initial encounter for closed fracture: Secondary | ICD-10-CM

## 2018-02-03 ENCOUNTER — Encounter (HOSPITAL_COMMUNITY)
Admission: RE | Admit: 2018-02-03 | Discharge: 2018-02-03 | Disposition: A | Discharge status: Home / Self Care | Payer: Worker's Compensation | Source: Ambulatory Visit | Attending: Preventative Medicine | Admitting: Preventative Medicine

## 2018-02-03 ENCOUNTER — Encounter (HOSPITAL_COMMUNITY): Payer: Self-pay

## 2018-02-03 DIAGNOSIS — S6292XA Unspecified fracture of left wrist and hand, initial encounter for closed fracture: Secondary | ICD-10-CM | POA: Insufficient documentation

## 2018-02-03 MED ORDER — TECHNETIUM TC 99M MEDRONATE IV KIT
20.0000 | PACK | Freq: Once | INTRAVENOUS | Status: AC | PRN
  Administered 2018-02-03: 18.3 via INTRAVENOUS

## 2018-03-01 ENCOUNTER — Other Ambulatory Visit: Payer: Self-pay | Admitting: Orthopedic Surgery

## 2018-03-01 DIAGNOSIS — R229 Localized swelling, mass and lump, unspecified: Principal | ICD-10-CM

## 2018-03-01 DIAGNOSIS — IMO0002 Reserved for concepts with insufficient information to code with codable children: Secondary | ICD-10-CM

## 2018-03-18 ENCOUNTER — Ambulatory Visit
Admission: RE | Admit: 2018-03-18 | Discharge: 2018-03-18 | Disposition: A | Discharge status: Home / Self Care | Payer: Self-pay | Source: Ambulatory Visit | Attending: Orthopedic Surgery | Admitting: Orthopedic Surgery

## 2018-03-18 ENCOUNTER — Other Ambulatory Visit: Payer: Self-pay

## 2018-03-18 DIAGNOSIS — R229 Localized swelling, mass and lump, unspecified: Principal | ICD-10-CM

## 2018-03-18 DIAGNOSIS — IMO0002 Reserved for concepts with insufficient information to code with codable children: Secondary | ICD-10-CM

## 2019-06-14 ENCOUNTER — Emergency Department (HOSPITAL_COMMUNITY)
Admission: EM | Admit: 2019-06-14 | Discharge: 2019-06-14 | Disposition: A | Discharge status: Home / Self Care | Payer: Worker's Compensation | Attending: Emergency Medicine | Admitting: Emergency Medicine

## 2019-06-14 ENCOUNTER — Other Ambulatory Visit: Payer: Self-pay

## 2019-06-14 ENCOUNTER — Emergency Department (HOSPITAL_COMMUNITY): Payer: Medicaid - Out of State | Attending: Emergency Medicine

## 2019-06-14 ENCOUNTER — Encounter (HOSPITAL_COMMUNITY): Payer: Self-pay | Admitting: *Deleted

## 2019-06-14 DIAGNOSIS — S8992XA Unspecified injury of left lower leg, initial encounter: Secondary | ICD-10-CM | POA: Diagnosis present

## 2019-06-14 DIAGNOSIS — J45909 Unspecified asthma, uncomplicated: Secondary | ICD-10-CM | POA: Insufficient documentation

## 2019-06-14 DIAGNOSIS — W228XXA Striking against or struck by other objects, initial encounter: Secondary | ICD-10-CM | POA: Insufficient documentation

## 2019-06-14 DIAGNOSIS — S8011XA Contusion of right lower leg, initial encounter: Secondary | ICD-10-CM | POA: Diagnosis not present

## 2019-06-14 DIAGNOSIS — Y929 Unspecified place or not applicable: Secondary | ICD-10-CM | POA: Diagnosis not present

## 2019-06-14 DIAGNOSIS — Z91010 Allergy to peanuts: Secondary | ICD-10-CM | POA: Insufficient documentation

## 2019-06-14 DIAGNOSIS — Y939 Activity, unspecified: Secondary | ICD-10-CM | POA: Insufficient documentation

## 2019-06-14 DIAGNOSIS — Y990 Civilian activity done for income or pay: Secondary | ICD-10-CM | POA: Diagnosis not present

## 2019-06-14 DIAGNOSIS — F17290 Nicotine dependence, other tobacco product, uncomplicated: Secondary | ICD-10-CM | POA: Diagnosis not present

## 2019-06-14 MED ORDER — IBUPROFEN 400 MG PO TABS
600.0000 mg | ORAL_TABLET | Freq: Once | ORAL | Status: AC
  Administered 2019-06-14: 08:00:00 600 mg via ORAL
  Filled 2019-06-14: qty 2

## 2019-06-14 MED ORDER — HYDROCODONE-ACETAMINOPHEN 5-325 MG PO TABS
1.0000 | ORAL_TABLET | Freq: Once | ORAL | Status: AC
  Administered 2019-06-14: 1 via ORAL
  Filled 2019-06-14: qty 1

## 2019-06-14 NOTE — ED Notes (Signed)
Patient assisted to wheelchair for discharge

## 2019-06-14 NOTE — ED Triage Notes (Signed)
Pt was at work and had a 50lb object land on her lower right leg; pt has limited ROM and pain

## 2019-06-14 NOTE — Discharge Instructions (Signed)
Take ibuprofen 600 mg every 6 hours as needed for pain. Activity as tolerated.

## 2019-06-18 NOTE — ED Provider Notes (Signed)
[name] EMERGENCY DEPARTMENT Provider Note   CSN: [id number] Arrival date & time: 06/14/19  0444     History   Chief Complaint Chief Complaint  Patient presents with  . Leg Injury    HPI Denise Schultz is a 23 y.o. female.     HPI   23 year old female with right leg pain.  Patient was at work when she had a large, heavy object strike her in her distal right leg.  She has had persistent pain since then.  She can bear weight although some increased pain.  Denies any other acute injuries.  Past Medical History:  Diagnosis Date  . Asthma     There are no active problems to display for this patient.   Past Surgical History:  Procedure Laterality Date  . CESAREAN SECTION    . CHOLECYSTECTOMY       OB History    Gravida  1   Para      Term      Preterm      AB      Living        SAB      TAB      Ectopic      Multiple      Live Births               Home Medications    Prior to Admission medications   Medication Sig Start Date End Date Taking? Authorizing Provider  albuterol (PROVENTIL HFA;VENTOLIN HFA) 108 (90 BASE) MCG/ACT inhaler Inhale 1-2 puffs into the lungs every 6 (six) hours as needed for wheezing or shortness of breath.    [provider]  albuterol (PROVENTIL) (2.5 MG/3ML) 0.083% nebulizer solution Take 2.5 mg by nebulization every 6 (six) hours as needed for wheezing or shortness of breath.    [provider]  HYDROcodone-acetaminophen (NORCO/VICODIN) 5-325 MG per tablet Take 1 tablet by mouth every 4 (four) hours as needed for moderate pain. 08/18/14   [name], MD  ondansetron (ZOFRAN) 4 MG tablet Take 1 tablet (4 mg total) by mouth every 6 (six) hours. 09/05/14   [name], MD    Family History History reviewed. No pertinent family history.  Social History Social History   Tobacco Use  . Smoking status: Light Tobacco Smoker    Types: Cigars  . Smokeless tobacco: Never Used  Substance Use Topics   . Alcohol use: No  . Drug use: No     Allergies   Peanut-containing drug products, Shellfish allergy, Strawberry extract, and Contrast media [iodinated diagnostic agents]   Review of Systems Review of Systems  All systems reviewed and negative, other than as noted in HPI.  Physical Exam Updated Vital Signs BP 138/86 (BP Location: Left Arm)   Pulse 79   Temp 98.5 F (36.9 C) (Oral)   Resp 16   Ht 5\' 4"  (1.626 m)   Wt 88.5 kg   LMP 06/06/2019   SpO2 100%   BMI 33.47 kg/m   Physical Exam Vitals signs and nursing note reviewed.  Constitutional:      General: She is not in acute distress.    Appearance: She is well-developed.  HENT:     Head: Normocephalic and atraumatic.  Eyes:     General:        Right eye: No discharge.        Left eye: No discharge.     Conjunctiva/sclera: Conjunctivae normal.  Neck:     Musculoskeletal: Neck supple.  Cardiovascular:     Rate and Rhythm: Normal rate and regular rhythm.     Heart sounds: Normal heart sounds. No murmur. No friction rub. No gallop.   Pulmonary:     Effort: Pulmonary effort is normal. No respiratory distress.     Breath sounds: Normal breath sounds.  Abdominal:     General: There is no distension.     Palpations: Abdomen is soft.     Tenderness: There is no abdominal tenderness.  Musculoskeletal:        General: Tenderness present.     Comments: Small contusion to the medial aspect of the right lower leg just above the ankle.  No pain at the ankle itself.  Can actively range the ankle without difficulty.  Skin intact.  Neurovascular intact.  Skin:    General: Skin is warm and dry.  Neurological:     Mental Status: She is alert.  Psychiatric:        Behavior: Behavior normal.        Thought Content: Thought content normal.      ED Treatments / Results  Labs (all labs ordered are listed, but only abnormal results are displayed) Labs Reviewed - No data to display  EKG None  Radiology No results  found.   Dg Tibia/fibula Right  Result Date: 06/14/2019 CLINICAL DATA:  Right lower extremity injury. EXAM: RIGHT TIBIA AND FIBULA - 2 VIEW COMPARISON:  No recent prior. FINDINGS: No acute bony or joint abnormality. No evidence of fracture or dislocation. No radiopaque foreign body. IMPRESSION: No acute abnormality. Electronically Signed   By: Marcello Moores  Register   On: 06/14/2019 07:14     Procedures Procedures (including critical care time)  Medications Ordered in ED Medications  HYDROcodone-acetaminophen (NORCO/VICODIN) 5-325 MG per tablet 1 tablet (1 tablet Oral Given 06/14/19 0742)  ibuprofen (ADVIL) tablet 600 mg (600 mg Oral Given 06/14/19 0742)     Initial Impression / Assessment and Plan / ED Course  I have reviewed the triage vital signs and the nursing notes.  Pertinent labs & imaging results that were available during my care of the patient were reviewed by me and considered in my medical decision making (see chart for details).    23 year old female with contusion right lower extremity.  Neurovascular intact.  Negative imaging.  Plan symptomatic treatment.  Work note provided.  Activity as tolerated.  Final Clinical Impressions(s) / ED Diagnoses   Final diagnoses:  Contusion of multiple sites of right lower extremity, initial encounter    ED Discharge Orders    None       Virgel Manifold, MD 06/18/19 [phone]

## 2019-10-28 ENCOUNTER — Encounter (HOSPITAL_COMMUNITY): Payer: Self-pay | Admitting: Emergency Medicine

## 2019-10-28 ENCOUNTER — Other Ambulatory Visit: Payer: Self-pay

## 2019-10-28 ENCOUNTER — Emergency Department (HOSPITAL_COMMUNITY)
Admission: EM | Admit: 2019-10-28 | Discharge: 2019-10-29 | Disposition: A | Discharge status: Home / Self Care | Payer: Medicaid - Out of State | Attending: Emergency Medicine | Admitting: Emergency Medicine

## 2019-10-28 ENCOUNTER — Emergency Department (HOSPITAL_COMMUNITY): Payer: Medicaid - Out of State

## 2019-10-28 DIAGNOSIS — N898 Other specified noninflammatory disorders of vagina: Secondary | ICD-10-CM | POA: Insufficient documentation

## 2019-10-28 DIAGNOSIS — Z79899 Other long term (current) drug therapy: Secondary | ICD-10-CM | POA: Diagnosis not present

## 2019-10-28 DIAGNOSIS — R0789 Other chest pain: Secondary | ICD-10-CM | POA: Diagnosis present

## 2019-10-28 DIAGNOSIS — F17210 Nicotine dependence, cigarettes, uncomplicated: Secondary | ICD-10-CM | POA: Insufficient documentation

## 2019-10-28 DIAGNOSIS — J4520 Mild intermittent asthma, uncomplicated: Secondary | ICD-10-CM | POA: Insufficient documentation

## 2019-10-28 LAB — WET PREP, GENITAL
Clue Cells Wet Prep HPF POC: NONE SEEN
Sperm: NONE SEEN
Trich, Wet Prep: NONE SEEN
Yeast Wet Prep HPF POC: NONE SEEN

## 2019-10-28 MED ORDER — ALBUTEROL SULFATE HFA 108 (90 BASE) MCG/ACT IN AERS
2.0000 | INHALATION_SPRAY | Freq: Once | RESPIRATORY_TRACT | Status: AC
  Administered 2019-10-28: 2 via RESPIRATORY_TRACT
  Filled 2019-10-28: qty 6.7

## 2019-10-28 NOTE — ED Triage Notes (Signed)
Pt reports her emergency tonight is chest pain midsternal for 2 weeks - also a vaginal discharge for the last week- creamy white discharge

## 2019-10-28 NOTE — ED Notes (Signed)
Pt reports unprotected sex with white DC for the last week  When asked what BC she uses she replies none When asked if she might be pregnant, she replies "I don't know"  Reports midsternal chest pain for the last 2 weeks  She has taken no meds, nor sought eval for same  She reports pain 10/10   Monitor, EKG obtained

## 2019-10-28 NOTE — ED Provider Notes (Signed)
[facility] EMERGENCY DEPARTMENT Provider Note   CSN: [id number] Arrival date & time: 10/28/19  2219     History Chief Complaint  Patient presents with  . Chest Pain    x 2 weeks   . vaginal disharge    x 1 week     Denise Schultz is a 24 y.o. female.  HPI      Denise Schultz is a 24 y.o. female with hx of asthma who presents to the Emergency Department complaining of upper chest pain intermittently for 2 weeks.  She describes the pain as sharp and associated with wheezing and tightness. The symptoms improve when she uses her albuterol inhaler.  She has also noticed a vaginal discharge for one week that she describes as thick, white and creamy in consistency.  She denies abdominal pain, dysuria, fever, shortness of breath and cough, nausea, and vomiting and diarrhea.  No abnormal vaginal bleeding or new sexual partners.  She denies known COVID exposures.    Past Medical History:  Diagnosis Date  . Asthma     There are no problems to display for this patient.   Past Surgical History:  Procedure Laterality Date  . CESAREAN SECTION    . CHOLECYSTECTOMY       OB History    Gravida  1   Para      Term      Preterm      AB      Living        SAB      TAB      Ectopic      Multiple      Live Births              No family history on file.  Social History   Tobacco Use  . Smoking status: Heavy Tobacco Smoker    Packs/day: 1.00    Types: Cigars  . Smokeless tobacco: Never Used  Substance Use Topics  . Alcohol use: No  . Drug use: No    Home Medications Prior to Admission medications   Medication Sig Start Date End Date Taking? Authorizing Provider  albuterol (PROVENTIL HFA;VENTOLIN HFA) 108 (90 BASE) MCG/ACT inhaler Inhale 1-2 puffs into the lungs every 6 (six) hours as needed for wheezing or shortness of breath.    [provider]  albuterol (PROVENTIL) (2.5 MG/3ML) 0.083% nebulizer solution Take 2.5 mg by nebulization every 6 (six)  hours as needed for wheezing or shortness of breath.    [provider]  HYDROcodone-acetaminophen (NORCO/VICODIN) 5-325 MG per tablet Take 1 tablet by mouth every 4 (four) hours as needed for moderate pain. 08/18/14   Jola Schmidt, MD  ondansetron (ZOFRAN) 4 MG tablet Take 1 tablet (4 mg total) by mouth every 6 (six) hours. 09/05/14   Dorie Rank, MD    Allergies    Peanut-containing drug products, Shellfish allergy, Strawberry extract, and Contrast media [iodinated diagnostic agents]  Review of Systems   Review of Systems  Constitutional: Negative for appetite change, chills and fever.  HENT: Negative for congestion.   Respiratory: Positive for chest tightness and wheezing. Negative for cough and shortness of breath.   Cardiovascular: Positive for chest pain.  Gastrointestinal: Negative for abdominal pain, diarrhea, nausea and vomiting.  Genitourinary: Positive for vaginal discharge. Negative for dysuria, flank pain, pelvic pain, urgency and vaginal bleeding.  Musculoskeletal: Negative for arthralgias and neck pain.  Neurological: Negative for dizziness, weakness and headaches.    Physical Exam Updated Vital  Signs BP (!) 126/94 (BP Location: Right Arm)   Pulse 74   Temp 98.4 F (36.9 C) (Oral)   Resp 18   Ht 5\' 4"  (1.626 m)   Wt 81.6 kg   LMP 10/16/2019   SpO2 100%   BMI 30.90 kg/m   Physical Exam Vitals and nursing note reviewed. Exam conducted with a chaperone present.  Constitutional:      General: She is not in acute distress.    Appearance: Normal appearance. She is not ill-appearing.  HENT:     Mouth/Throat:     Mouth: Mucous membranes are moist.     Pharynx: Oropharynx is clear.  Cardiovascular:     Rate and Rhythm: Normal rate and regular rhythm.     Pulses: Normal pulses.  Pulmonary:     Effort: Pulmonary effort is normal. No respiratory distress.     Breath sounds: No stridor. Wheezing present.     Comments: Few scattered expiratory wheezes.  No  rales.  No respiratory distress Chest:     Chest wall: Tenderness (ttp of the mid upper chest wall.  no crepitus.  ) present.  Abdominal:     Palpations: Abdomen is soft.     Tenderness: There is no abdominal tenderness.  Genitourinary:    Vagina: Vaginal discharge present.     Cervix: No friability, lesion or cervical bleeding.     Uterus: Normal. Not enlarged.      Adnexa: Right adnexa normal and left adnexa normal.       Right: No mass or tenderness.         Left: No mass or tenderness.       Comments: Creamy white vaginal discharge.  No adnexal masses or tenderness.  No vaginal bleeding.   Musculoskeletal:        General: Normal range of motion.  Skin:    General: Skin is warm.     Capillary Refill: Capillary refill takes less than 2 seconds.     Findings: No rash.  Neurological:     General: No focal deficit present.     Mental Status: She is alert.     Sensory: No sensory deficit.     Motor: No weakness.     ED Results / Procedures / Treatments   Labs (all labs ordered are listed, but only abnormal results are displayed) Labs Reviewed  WET PREP, GENITAL - Abnormal; Notable for the following components:      Result Value   WBC, Wet Prep HPF POC FEW (*)    All other components within normal limits  URINALYSIS, ROUTINE W REFLEX MICROSCOPIC - Abnormal; Notable for the following components:   APPearance HAZY (*)    Hgb urine dipstick SMALL (*)    Bacteria, UA RARE (*)    All other components within normal limits  PREGNANCY, URINE  GC/CHLAMYDIA PROBE AMP ([place]) NOT AT [facility]    EKG EKG Interpretation  Date/Time:  Saturday October 28 2019 22:51:07 EST Ventricular Rate:  87 PR Interval:    QRS Duration: 89 QT Interval:  352 QTC Calculation: 424 R Axis:   71 Text Interpretation: Sinus rhythm Low voltage, precordial leads Borderline T abnormalities, anterior leads No old tracing to compare Confirmed by [id number] [phone]) on 10/28/2019 11:12:33  PM   Radiology DG Chest Portable 1 View  Result Date: 10/28/2019 CLINICAL DATA:  Chest pain EXAM: PORTABLE CHEST 1 VIEW COMPARISON:  None. FINDINGS: The heart size and mediastinal contours are within normal limits. Both  lungs are clear. The visualized skeletal structures are unremarkable. IMPRESSION: No active disease. Electronically Signed   By: Katherine Mantle M.D.   On: 10/28/2019 23:58    Procedures Procedures (including critical care time)  Medications Ordered in ED Medications  albuterol (VENTOLIN HFA) 108 (90 Base) MCG/ACT inhaler 2 puff (has no administration in time range)    ED Course  I have reviewed the triage vital signs and the nursing notes.  Pertinent labs & imaging results that were available during my care of the patient were reviewed by me and considered in my medical decision making (see chart for details).    MDM Rules/Calculators/A&P                      Pt is well appearing and talking on the phone.  Abdomen is soft and non-tender. Chest pain is reproduced with ttp. Likely musculoskeletal.   Vitals reviewed.  PERC neg.   Doubt ACS.  Lung sounds are CTAB after albuterol MDI use.  No fever, cough, shortness of breath or GI sx's.  Doubt COVID      Final Clinical Impression(s) / ED Diagnoses Final diagnoses:  Vaginal discharge  Mild intermittent asthma without complication    Rx / DC Orders ED Discharge Orders    None       Pauline Aus, PA-C 10/30/19 1235    Mancel Bale, MD 11/02/19 [phone]

## 2019-10-29 LAB — URINALYSIS, ROUTINE W REFLEX MICROSCOPIC
Bilirubin Urine: NEGATIVE
Glucose, UA: NEGATIVE mg/dL
Ketones, ur: NEGATIVE mg/dL
Leukocytes,Ua: NEGATIVE
Nitrite: NEGATIVE
Protein, ur: NEGATIVE mg/dL
Specific Gravity, Urine: 1.024 (ref 1.005–1.030)
pH: 6 (ref 5.0–8.0)

## 2019-10-29 LAB — PREGNANCY, URINE: Preg Test, Ur: NEGATIVE

## 2019-10-29 MED ORDER — PREDNISONE 20 MG PO TABS: 40.0000 mg | ORAL_TABLET | Freq: Every day | ORAL | 0 refills | Status: DC

## 2019-10-29 NOTE — Discharge Instructions (Addendum)
Continue the albuterol inhaler 1 to 2 puffs every 4-6 hours as needed.  Start the prednisone prescription tomorrow.  Your remaining tests are still pending.  You will be notified if your tests are positive.  You may also review your results on MyChart

## 2019-10-31 LAB — GC/CHLAMYDIA PROBE AMP (~~LOC~~) NOT AT ARMC
Chlamydia: NEGATIVE
Neisseria Gonorrhea: NEGATIVE

## 2020-05-07 IMAGING — NM NM BONE LIMITED
2 series · 2 of 2 positions shown · non-contrast
Comparison: X-ray January 27, 2018

CLINICAL DATA: Pain after trauma

EXAM:
NUCLEAR MEDICINE BONE LIMITED
TECHNIQUE: After intravenous administration of radiopharmaceutical, delayed
planar images were obtained in multiple projections through the
limited area of interest.
RADIOPHARMACEUTICALS:  18.0 millicuries of technetium 99 M MDP.

[Series 1: bone statics · 2.07mm/px · 1 of 1 slices shown (1 of 2)]
[im 1/1  full-range]
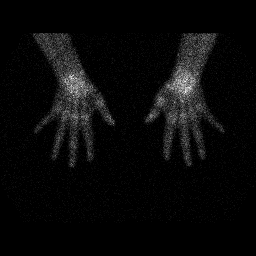

[Series 1: bone statics · 2.07mm/px · 1 of 1 slices shown (2 of 2)]
[im 1/1  full-range]
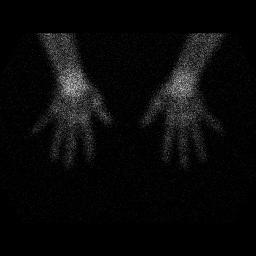

[2 of 2 positions shown; findings below may reference images not displayed]

FINDINGS: No fractures identified on the right. There is slight increased
uptake at the first left interphalangeal joint versus the right
which is subtle. This is only seen on posterior imaging. The
remainder of uptake on the left including at the base of the
proximal first phalanx/MCP joint is symmetric to the right.
IMPRESSION: 1. No definitive fracture seen.
2. Mild/subtle increased uptake at the left first interphalangeal
joint versus the right is nonspecific. This region was normal on
recent x-ray imaging and the level of uptake is less focal and
intense than expected for an acute or subacute fracture. Recommend
clinical correlation in this region.

## 2022-01-29 IMAGING — DX DG CHEST 1V PORT
1 series · 1 of 1 positions shown · non-contrast
Comparison: None.

CLINICAL DATA: Chest pain

EXAM:
PORTABLE CHEST 1 VIEW

[chest ap]
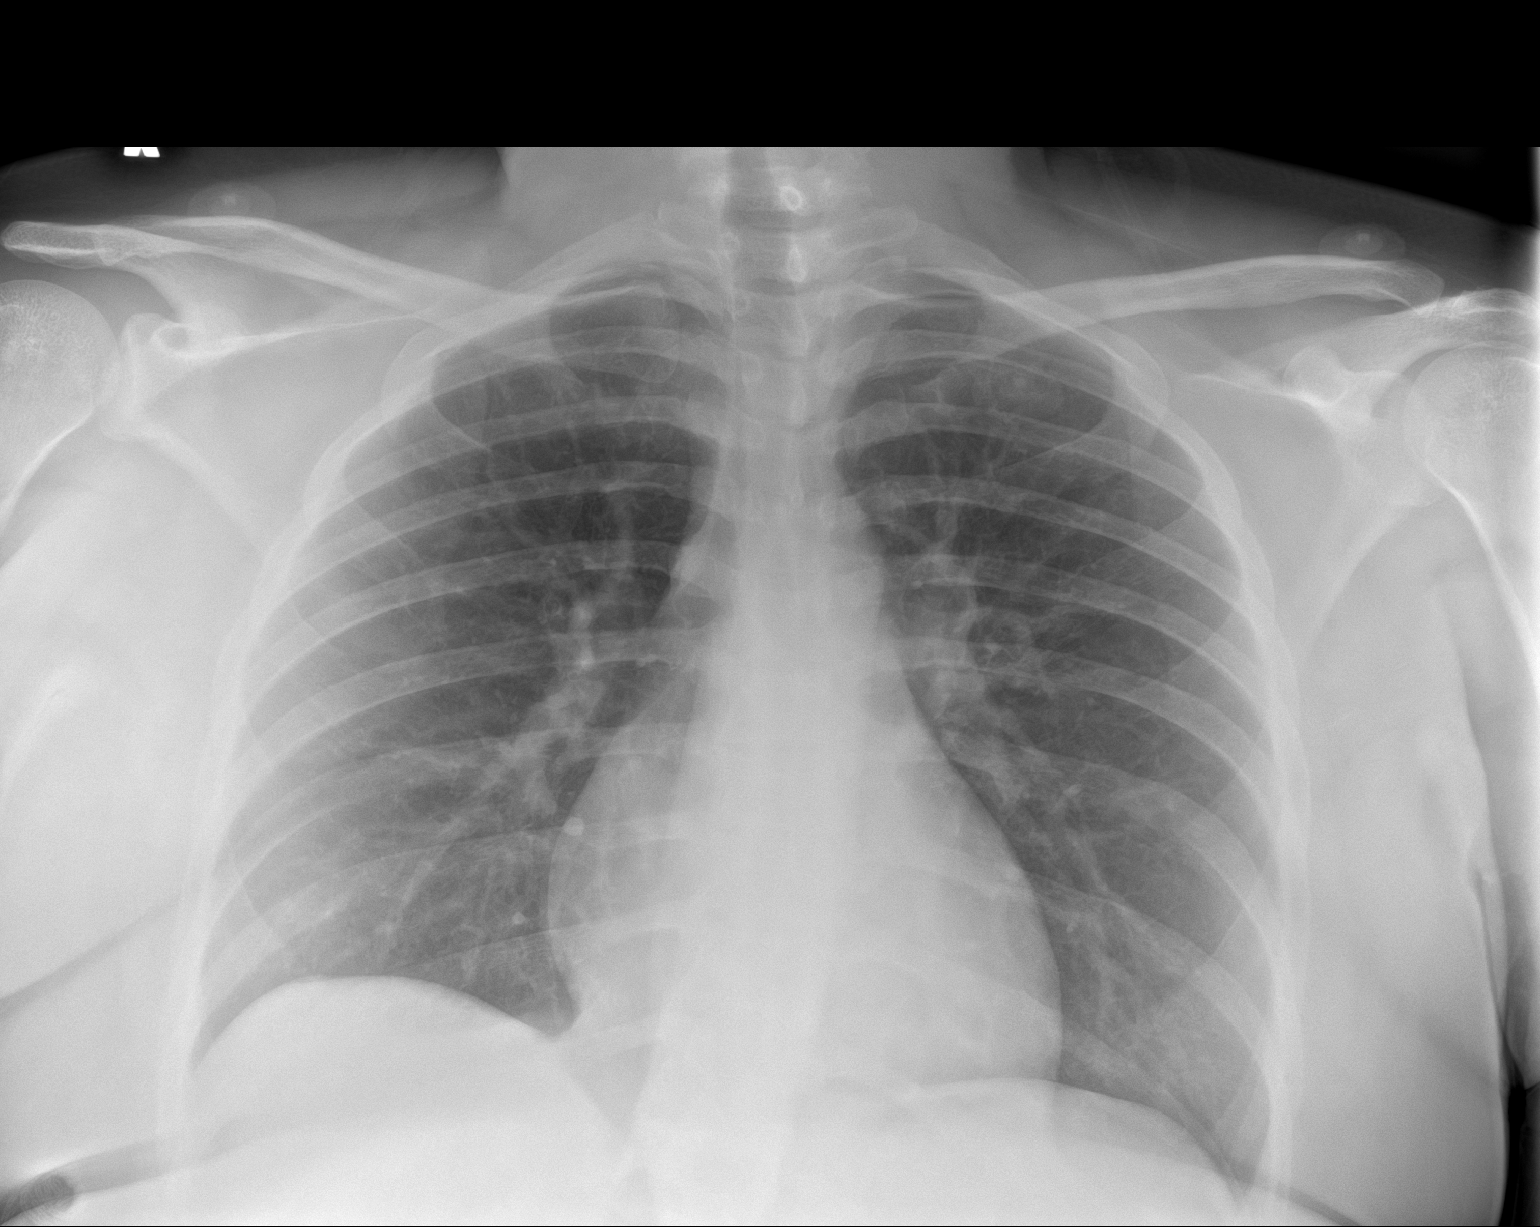

[1 of 1 positions shown; findings below may reference images not displayed]

FINDINGS: The heart size and mediastinal contours are within normal limits.
Both lungs are clear. The visualized skeletal structures are
unremarkable.
IMPRESSION: No active disease.

## 2024-08-23 ENCOUNTER — Other Ambulatory Visit: Payer: Self-pay

## 2024-08-23 ENCOUNTER — Emergency Department (HOSPITAL_COMMUNITY)

## 2024-08-23 ENCOUNTER — Encounter (HOSPITAL_COMMUNITY): Payer: Self-pay | Admitting: Emergency Medicine

## 2024-08-23 ENCOUNTER — Emergency Department (HOSPITAL_COMMUNITY)
Admission: EM | Admit: 2024-08-23 | Discharge: 2024-08-23 | Disposition: A | Discharge status: Home / Self Care | Attending: Emergency Medicine | Admitting: Emergency Medicine

## 2024-08-23 DIAGNOSIS — R0602 Shortness of breath: Secondary | ICD-10-CM | POA: Insufficient documentation

## 2024-08-23 DIAGNOSIS — F17290 Nicotine dependence, other tobacco product, uncomplicated: Secondary | ICD-10-CM | POA: Insufficient documentation

## 2024-08-23 DIAGNOSIS — J45909 Unspecified asthma, uncomplicated: Secondary | ICD-10-CM | POA: Insufficient documentation

## 2024-08-23 DIAGNOSIS — R0789 Other chest pain: Secondary | ICD-10-CM | POA: Diagnosis not present

## 2024-08-23 DIAGNOSIS — R072 Precordial pain: Secondary | ICD-10-CM | POA: Diagnosis not present

## 2024-08-23 DIAGNOSIS — Z7951 Long term (current) use of inhaled steroids: Secondary | ICD-10-CM | POA: Diagnosis not present

## 2024-08-23 DIAGNOSIS — R42 Dizziness and giddiness: Secondary | ICD-10-CM | POA: Diagnosis not present

## 2024-08-23 DIAGNOSIS — F17210 Nicotine dependence, cigarettes, uncomplicated: Secondary | ICD-10-CM | POA: Diagnosis not present

## 2024-08-23 LAB — CBC
HCT: 37.7 % (ref 36.0–46.0)
Hemoglobin: 12.2 g/dL (ref 12.0–15.0)
MCH: 29.3 pg (ref 26.0–34.0)
MCHC: 32.4 g/dL (ref 30.0–36.0)
MCV: 90.4 fL (ref 80.0–100.0)
Platelets: 338 K/uL (ref 150–400)
RBC: 4.17 MIL/uL (ref 3.87–5.11)
RDW: 12.4 % (ref 11.5–15.5)
WBC: 4.8 K/uL (ref 4.0–10.5)
nRBC: 0 % (ref 0.0–0.2)

## 2024-08-23 LAB — BASIC METABOLIC PANEL WITH GFR
Anion gap: 10 (ref 5–15)
BUN: 9 mg/dL (ref 6–20)
CO2: 25 mmol/L (ref 22–32)
Calcium: 9 mg/dL (ref 8.9–10.3)
Chloride: 105 mmol/L (ref 98–111)
Creatinine, Ser: 0.87 mg/dL (ref 0.44–1.00)
GFR, Estimated: >60 mL/min (ref >60)
Glucose, Bld: 89 mg/dL (ref 70–99)
Potassium: 3.3 mmol/L — ABNORMAL LOW (ref 3.5–5.1)
Sodium: 140 mmol/L (ref 135–145)

## 2024-08-23 LAB — D-DIMER, QUANTITATIVE: D-Dimer, Quant: 0.42 ug{FEU}/mL (ref 0.00–0.50)

## 2024-08-23 LAB — TROPONIN T, HIGH SENSITIVITY: Troponin T High Sensitivity: <15 ng/L (ref 0–19)

## 2024-08-23 LAB — HCG, SERUM, QUALITATIVE: Preg, Serum: NEGATIVE

## 2024-08-23 MED ORDER — PREDNISONE 20 MG PO TABS: 40.0000 mg | ORAL_TABLET | Freq: Every day | ORAL | 0 refills | Status: AC

## 2024-08-23 MED ORDER — ALBUTEROL SULFATE HFA 108 (90 BASE) MCG/ACT IN AERS: 2.0000 | INHALATION_SPRAY | RESPIRATORY_TRACT | 1 refills | Status: AC | PRN

## 2024-08-23 MED ORDER — ALBUTEROL SULFATE HFA 108 (90 BASE) MCG/ACT IN AERS
2.0000 | INHALATION_SPRAY | Freq: Once | RESPIRATORY_TRACT | Status: AC
  Administered 2024-08-23: 2 via RESPIRATORY_TRACT
  Filled 2024-08-23: qty 6.7

## 2024-08-23 NOTE — ED Provider Notes (Signed)
 [facility]-EMERGENCY DEPT [facility] Emergency Department Provider Note MRN:  [id number]  Arrival date & time: 08/23/24     Chief Complaint   Shortness of Breath   History of Present Illness   Denise Schultz is a 28 y.o. year-old female with a history of asthma presenting to the ED with chief complaint of shortness of breath.  Shortness of breath starting at work this evening at 9 PM.  Associated with some sharp chest pain that is worse with deep breathing.  Has a history of asthma but this feels different.  Review of Systems  A thorough review of systems was obtained and all systems are negative except as noted in the HPI and PMH.   Patient's Health History    Past Medical History:  Diagnosis Date   Asthma     Past Surgical History:  Procedure Laterality Date   CESAREAN SECTION     CHOLECYSTECTOMY      History reviewed. No pertinent family history.  Social History   Socioeconomic History   Marital status: Single    Spouse name: Not on file   Number of children: Not on file   Years of education: Not on file   Highest education level: Not on file  Occupational History   Not on file  Tobacco Use   Smoking status: Heavy Smoker    Current packs/day: 1.00    Types: Cigars, Cigarettes   Smokeless tobacco: Never  Vaping Use   Vaping status: Every Day   Substances: Nicotine, Flavoring  Substance and Sexual Activity   Alcohol use: No   Drug use: No   Sexual activity: Not on file  Other Topics Concern   Not on file  Social History Narrative   Not on file   Social Drivers of Health   Financial Resource Strain: Not on file  Food Insecurity: Not on file  Transportation Needs: Not on file  Physical Activity: Not on file  Stress: Not on file  Social Connections: Not on file  Intimate Partner Violence: Not on file     Physical Exam   Vitals:   08/23/24 0215 08/23/24 0300  BP: (!) 138/104   Pulse: 91 83  Resp: 16 14  Temp:    SpO2: 96% 100%     CONSTITUTIONAL: Well-appearing, NAD NEURO/PSYCH:  Alert and oriented x 3, no focal deficits EYES:  eyes equal and reactive ENT/NECK:  no LAD, no JVD CARDIO: Regular rate, well-perfused, normal S1 and S2 PULM:  CTAB no wheezing or rhonchi GI/GU:  non-distended, non-tender MSK/SPINE:  No gross deformities, no edema SKIN:  no rash, atraumatic   *Additional and/or pertinent findings included in MDM below  Diagnostic and Interventional Summary    EKG Interpretation Date/Time:  Wednesday August 23 2024 02:15:30 EST Ventricular Rate:  79 PR Interval:  148 QRS Duration:  89 QT Interval:  361 QTC Calculation: 414 R Axis:   75  Text Interpretation: Sinus rhythm Confirmed by Theadore Sharper [phone]) on 08/23/2024 2:17:05 AM       Labs Reviewed  BASIC METABOLIC PANEL WITH GFR - Abnormal; Notable for the following components:      Result Value   Potassium 3.3 (*)    All other components within normal limits  CBC  D-DIMER, QUANTITATIVE  HCG, SERUM, QUALITATIVE  TROPONIN T, HIGH SENSITIVITY    DG Chest 2 View  Final Result      Medications - No data to display   Procedures  /  Critical Care Procedures  ED  Course and Medical Decision Making  Initial Impression and Ddx Question asthma exacerbation versus MSK versus PE.  No tachycardia or hypoxia, also no wheezing on exam.  Awaiting labs.  Past medical/surgical history that increases complexity of ED encounter: Asthma  Interpretation of Diagnostics I personally reviewed the EKG and my interpretation is as follows: Sinus rhythm without ischemic findings  No significant blood count or electrolyte disturbance.  Troponin negative, D-dimer negative  Patient Reassessment and Ultimate Disposition/Management     Patient continues to be well-appearing with no respiratory distress, no increased work of breathing.  Has been having some cough recently, suspect mild asthma exacerbation, she explains that she borrowed her mom's inhaler  earlier in the day and it provided her with some relief for a few hours.  Discharged home with return precautions.  Patient management required discussion with the following services or consulting groups:  None  Complexity of Problems Addressed Acute illness or injury that poses threat of life of bodily function  Additional Data Reviewed and Analyzed Further history obtained from: Prior labs/imaging results  Additional Factors Impacting ED Encounter Risk Prescriptions  Ozell HERO. Theadore, MD [facility] Emergency Medicine [facility] [email or web address]  Final Clinical Impressions(s) / ED Diagnoses     ICD-10-CM   1. SOB (shortness of breath)  R06.02       ED Discharge Orders          Ordered    predniSONE  (DELTASONE ) 20 MG tablet  Daily        08/23/24 0335    albuterol  (VENTOLIN  HFA) 108 (90 Base) MCG/ACT inhaler  Every 4 hours PRN        08/23/24 0335             Discharge Instructions Discussed with and Provided to Patient:     Discharge Instructions      You were evaluated in the Emergency Department and after careful evaluation, we did not find any emergent condition requiring admission or further testing in the hospital.  Your exam/testing today is overall reassuring.  Symptoms may be due to a flare of your asthma, likely triggered by a viral illness.  Use the inhaler as needed every 4-6 hours, take the prednisone  steroid medication daily to help your lungs recover.  Recommend follow-up with your primary care doctor.  Please return to the Emergency Department if you experience any worsening of your condition.   Thank you for allowing us  to be a part of your care.       Theadore Ozell HERO, MD 08/23/24 [phone]

## 2024-08-23 NOTE — ED Triage Notes (Signed)
 Pt arrived to ED via POV c/o shortness of breath, chest pain and dizziness that started about 10pm last night. C/o mid sternal chest pain without radiation. Pt states she was at work when symptoms started.

## 2024-08-23 NOTE — Discharge Instructions (Signed)
 You were evaluated in the Emergency Department and after careful evaluation, we did not find any emergent condition requiring admission or further testing in the hospital.  Your exam/testing today is overall reassuring.  Symptoms may be due to a flare of your asthma, likely triggered by a viral illness.  Use the inhaler as needed every 4-6 hours, take the prednisone  steroid medication daily to help your lungs recover.  Recommend follow-up with your primary care doctor.  Please return to the Emergency Department if you experience any worsening of your condition.   Thank you for allowing us  to be a part of your care.
